# Patient Record
Sex: Male | Born: 1970 | Race: White | Hispanic: No | Marital: Married | Smoking: Current every day smoker
Health system: Southern US, Community
[De-identification: ages and names within clinical notes are randomized; demographics above are authoritative.]

## PROBLEM LIST (undated history)

## (undated) DIAGNOSIS — I10 Essential (primary) hypertension: Secondary | ICD-10-CM

## (undated) HISTORY — PX: FRACTURE SURGERY: SHX138

---

## 2008-11-03 ENCOUNTER — Emergency Department: Payer: Self-pay | Admitting: Emergency Medicine

## 2011-04-20 ENCOUNTER — Emergency Department: Payer: Self-pay | Admitting: *Deleted

## 2012-09-19 IMAGING — CT CT HEAD WITHOUT CONTRAST
2 series · 15 of 30 positions shown, 19 images · non-contrast
Comparison: none

REASON FOR EXAM: FALL  HIT HEAD  POS LOC
COMMENTS:   May transport without cardiac monitor

[Series 2: without · axial · non-contrast · 0.48mm/px · z∈[-194,-59]mm · 13 of 33 slices shown, 17 images]
[im 3/33  brain]
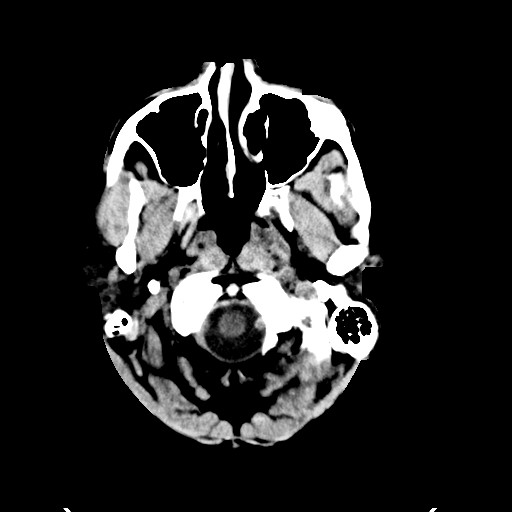
[im 3/33  bone]
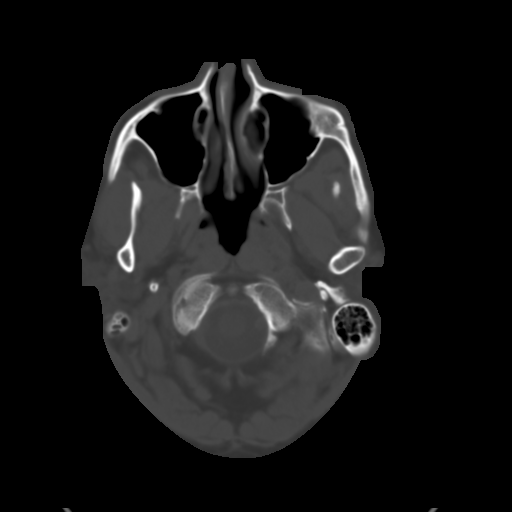
[im 5/33  brain]
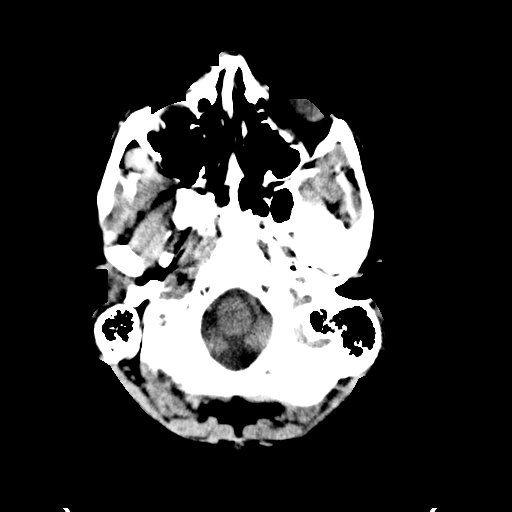
[im 7/33  brain]
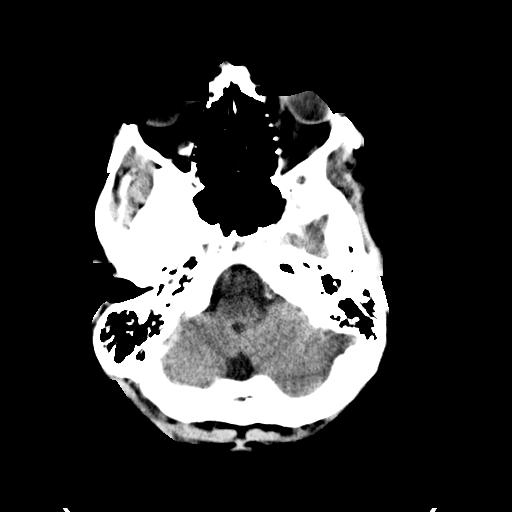
[im 10/33  brain]
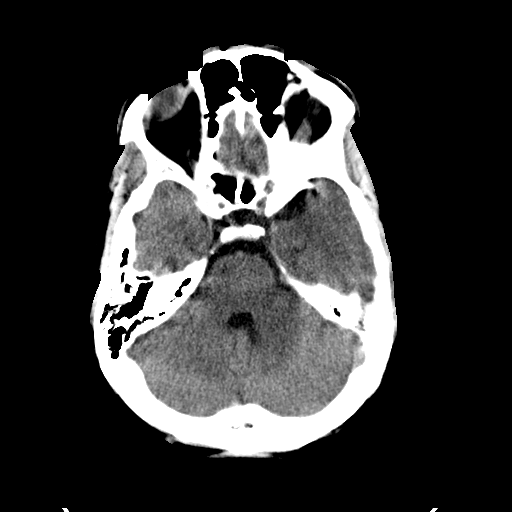
[im 12/33  brain]
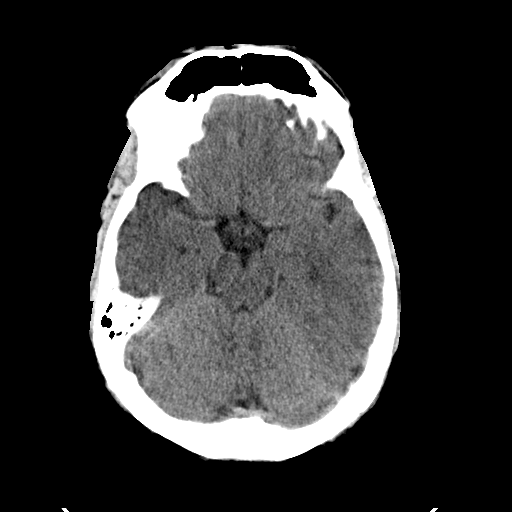
[im 12/33  bone]
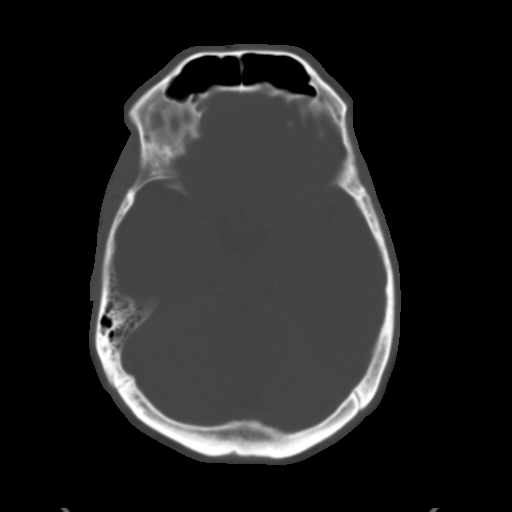
[im 14/33  brain]
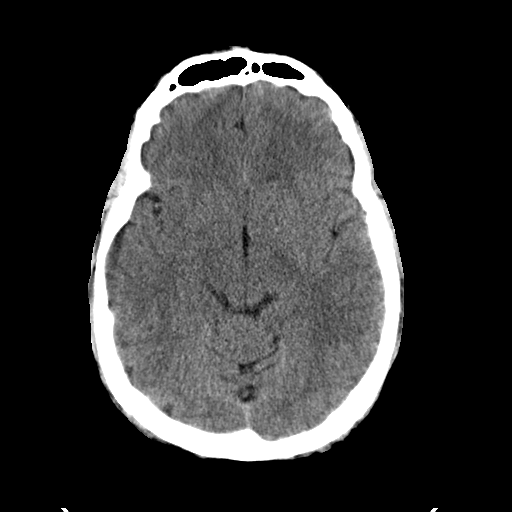
[im 17/33  brain]
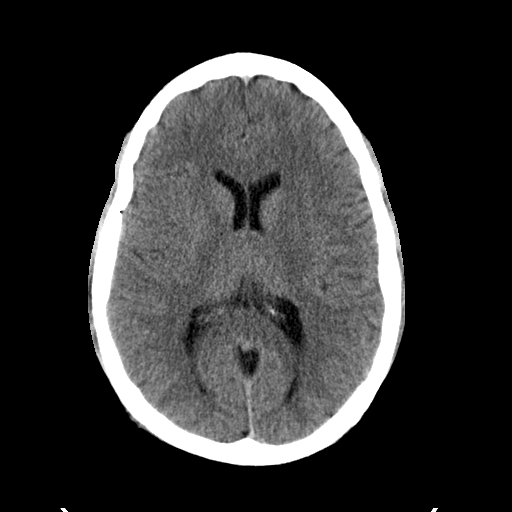
[im 19/33  brain]
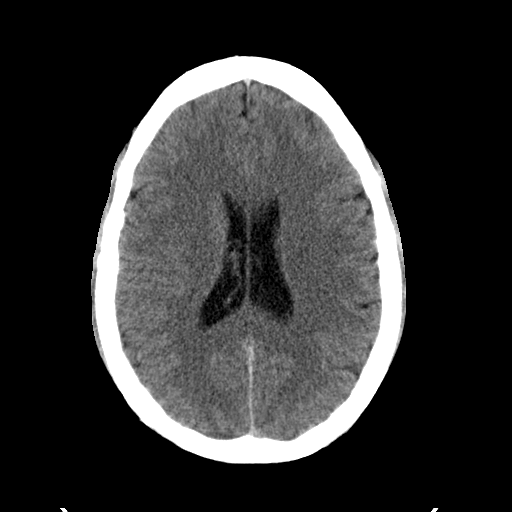
[im 21/33  brain]
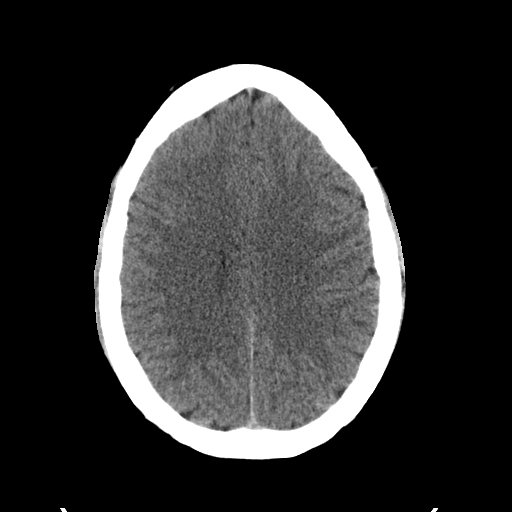
[im 21/33  bone]
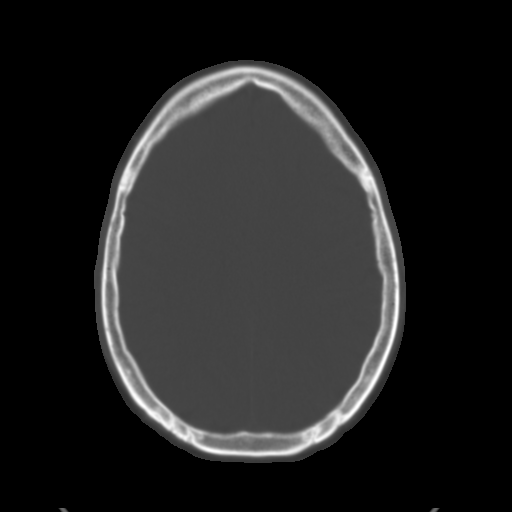
[im 23/33  brain]
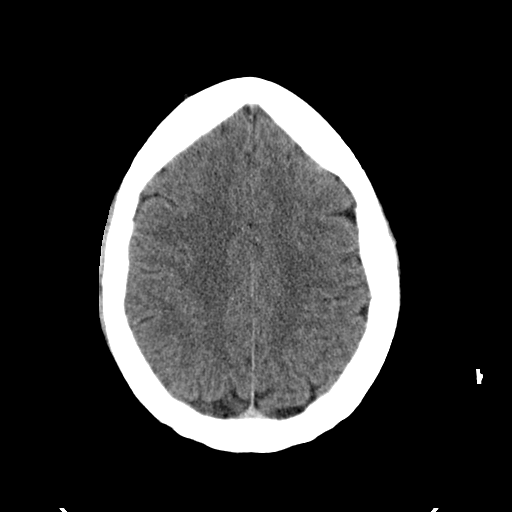
[im 26/33  brain]
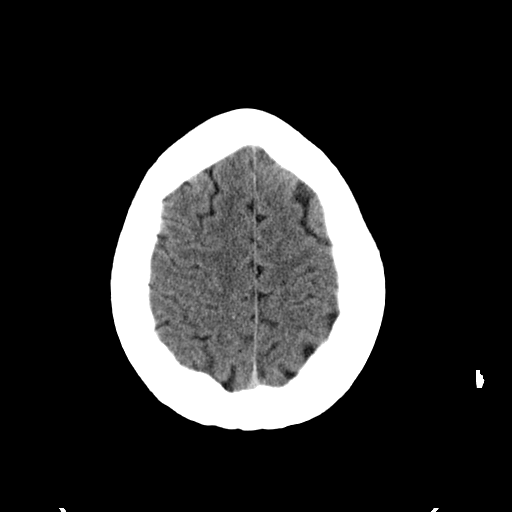
[im 28/33  brain]
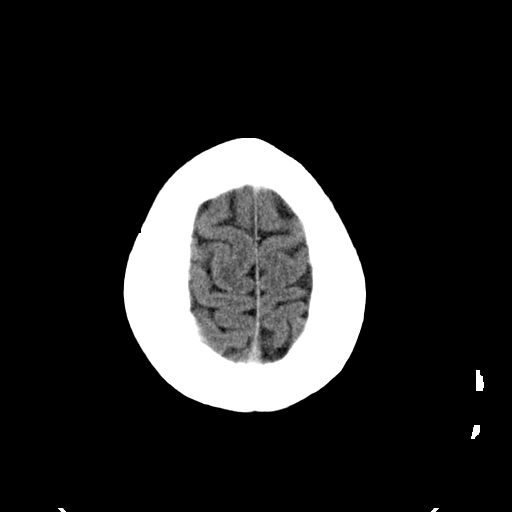
[im 30/33  brain]
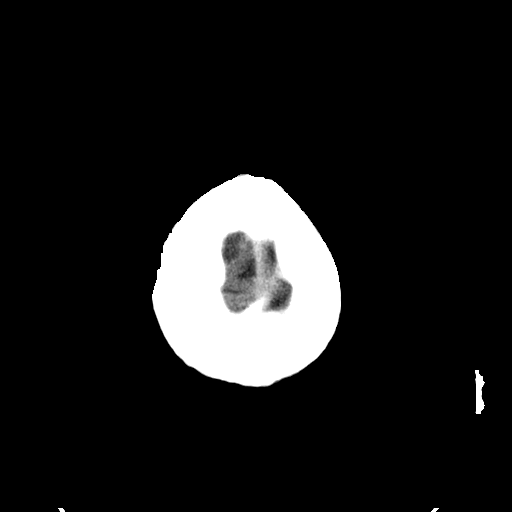
[im 30/33  bone]
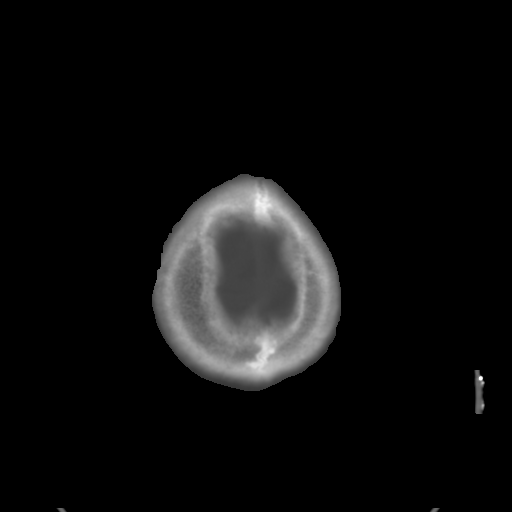

[Series 3: bone · axial · 0.48mm/px · z∈[-194,-174]mm · 2 of 33 slices shown]
[im 3/33  bone]
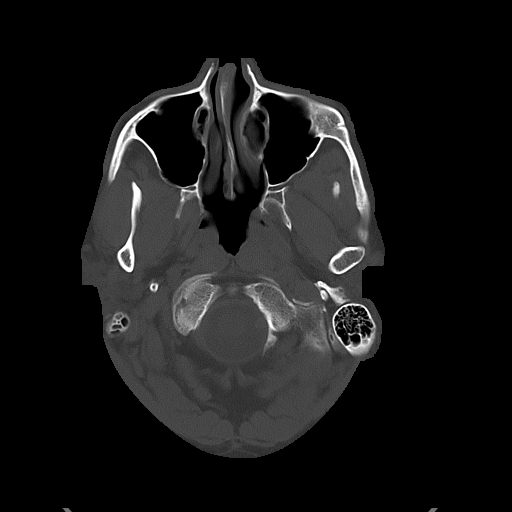
[im 7/33  bone]
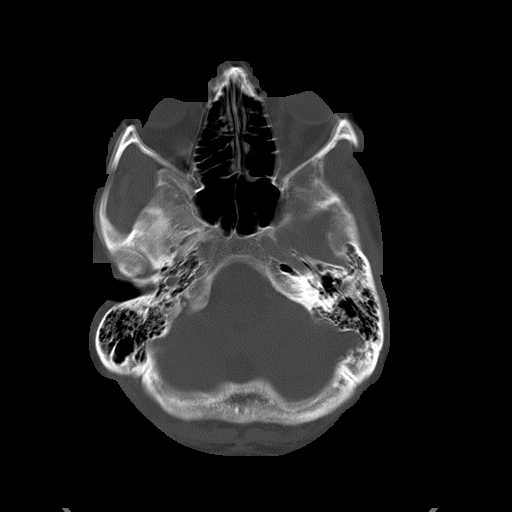

[15 of 30 positions shown; findings below may reference images not displayed]

PROCEDURE:     CT  - CT HEAD WITHOUT CONTRAST  - April 20, 2011  [DATE]

RESULT:     Axial noncontrast CT scanning was performed through the brain at
5 mm intervals and slice thicknesses.

The ventricles are normal in size and position. There is no intracranial
hemorrhage nor intracranial mass effect. The cerebellum and brainstem are
normal in density. There is no evidence of an evolving ischemic event. At
bone window settings the observed portions of the paranasal sinuses and
mastoid air cells are clear. There is soft tissue swelling on and a
laceration of the forehead. I do not see evidence of an acute skull
fracture. There is a depressed fracture of the nasal bone which may be acute
or old. There is overlying soft tissue swelling however.
IMPRESSION: 1. I see no acute intracranial hemorrhage nor other acute abnormality of the
brain.
2. There is no evidence of an acute skull fracture. There is a forehead
laceration and there is a depressed nasal bone fracture.

## 2018-06-07 ENCOUNTER — Ambulatory Visit
Admission: EM | Admit: 2018-06-07 | Discharge: 2018-06-07 | Disposition: A | Discharge status: Home / Self Care | Payer: BC Managed Care – PPO | Attending: Emergency Medicine | Admitting: Emergency Medicine

## 2018-06-07 ENCOUNTER — Encounter: Payer: Self-pay | Admitting: Emergency Medicine

## 2018-06-07 ENCOUNTER — Other Ambulatory Visit: Payer: Self-pay

## 2018-06-07 DIAGNOSIS — J0140 Acute pansinusitis, unspecified: Secondary | ICD-10-CM | POA: Diagnosis not present

## 2018-06-07 HISTORY — DX: Essential (primary) hypertension: I10

## 2018-06-07 MED ORDER — DOXYCYCLINE HYCLATE 100 MG PO CAPS: 100.0000 mg | ORAL_CAPSULE | Freq: Two times a day (BID) | ORAL | 0 refills | Status: AC

## 2018-06-07 NOTE — Discharge Instructions (Addendum)
try an antihistamine/decongestant combination such as Claritin D, Allegra-D, or Zyrtec-D, but if this elevates your blood pressure you should switch to regular Claritin, Allegra or Zyrtec.  saline nasal irrigation with a Lloyd HugerNeil med rinse and distilled water, start Flonase.  doxycycline for the sinusitis.

## 2018-06-07 NOTE — ED Provider Notes (Signed)
HPI  SUBJECTIVE:  Vincent Russo is a 47 y.o. male who presents with clear nasal congestion, rhinorrhea, postnasal drip, sinus pain and pressure for the past 2 to 3 weeks.  Reports chest congestion.  States that he felt feverish, but has no documented fevers.  No upper dental pain.  States his face feels swollen underneath his eyes.  Reports scratchy throat, wheezing in the morning that clears with coughing.  He denies chest pain, shortness of breath, dyspnea on exertion.  He reports allergy symptoms of sneezing, itchy, watery eyes.  He tried Aleve yesterday once without improvement in his symptoms.  No other aggravating or alleviating factors.  He has a past medical history of allergies.  He is a smoker and occasionally vapes.  No history of asthma, emphysema, COPD, diabetes.  He has hypertension for which he takes losartan.  No history of frequent sinusitis.  PMD: Dr. Dereck Leephomas Warcup    Past Medical History:  Diagnosis Date  . Hypertension     Past Surgical History:  Procedure Laterality Date  . FRACTURE SURGERY Right    scapula    Family History  Problem Relation Age of Onset  . CAD Mother   . Cancer Mother   . Hypertension Mother   . Hyperlipidemia Mother   . Diabetes Mother   . Hypertension Father   . Prostate cancer Father     Social History   Tobacco Use  . Smoking status: Current Every Day Smoker    Packs/day: 0.50    Types: Cigarettes  . Smokeless tobacco: Former NeurosurgeonUser    Quit date: 2016  Substance Use Topics  . Alcohol use: Yes    Alcohol/week: 12.0 standard drinks    Types: 12 Cans of beer per week  . Drug use: Never    No current facility-administered medications for this encounter.   Current Outpatient Medications:  .  losartan (COZAAR) 100 MG tablet, Take 1 tablet by mouth daily., Disp: , Rfl:  .  valACYclovir (VALTREX) 500 MG tablet, Take 1 tablet by mouth daily., Disp: , Rfl: 3 .  doxycycline (VIBRAMYCIN) 100 MG capsule, Take 1 capsule (100 mg total) by  mouth 2 (two) times daily for 7 days., Disp: 14 capsule, Rfl: 0  No Known Allergies   ROS  As noted in HPI.   Physical Exam  BP (!) 165/91 (BP Location: Left Arm)   Pulse 79   Temp 98.7 F (37.1 C) (Oral)   Resp 16   Ht 6\' 1"  (1.854 m)   Wt 95.3 kg   SpO2 98%   BMI 27.71 kg/m   Constitutional: Well developed, well nourished, no acute distress Eyes:  EOMI, conjunctiva normal bilaterally HENT: Normocephalic, atraumatic,mucus membranes moist.  Positive mucoid nasal congestion with erythematous, swollen turbinates.  Positive maxillary and frontal sinus tenderness.  Normal oropharynx.  Positive postnasal drip. Respiratory: Normal inspiratory effort lungs clear bilaterally, good air movement. Cardiovascular: Normal rate regular rhythm, no murmurs, rubs, gallops GI: nondistended skin: No rash, skin intact Musculoskeletal: no deformities Neurologic: Alert & oriented x 3, no focal neuro deficits Psychiatric: Speech and behavior appropriate   ED Course   Medications - No data to display  No orders of the defined types were placed in this encounter.   No results found for this or any previous visit (from the past 24 hour(s)). No results found.  ED Clinical Impression  Acute non-recurrent pansinusitis   ED Assessment/Plan  Patient with a sinusitis.  Symptoms appear  to be allergy  driven.  Will have him try an antihistamine/decongestant combination such as Claritin D, Allegra-D, or Zyrtec-D, but if this elevates his blood pressure he is to switch to regular Claritin, Allegra or Zyrtec.  Advised saline nasal irrigation with a Lloyd Huger med rinse and distilled water, he is to start Flonase.  Due to the duration of symptoms, will also send home with doxycycline for the sinusitis.  No evidence of pneumonia.  Follow-up with PMD as needed.  Discussed l MDM, treatment plan, and plan for follow-up with patient.patient agrees with plan.   Meds ordered this encounter  Medications  .  doxycycline (VIBRAMYCIN) 100 MG capsule    Sig: Take 1 capsule (100 mg total) by mouth 2 (two) times daily for 7 days.    Dispense:  14 capsule    Refill:  0    *This clinic note was created using Scientist, clinical (histocompatibility and immunogenetics). Therefore, there may be occasional mistakes despite careful proofreading.   ?    Domenick Gong, MD 06/07/18 2121

## 2018-06-07 NOTE — ED Triage Notes (Signed)
Patient in today c/o cough, sinus pressure x 3 weeks. Patient woke up in sweats this morning, no other fever. Patient has not tried any OTC medications.

## 2021-05-04 ENCOUNTER — Ambulatory Visit
Admission: RE | Admit: 2021-05-04 | Discharge: 2021-05-04 | Disposition: A | Discharge status: Home / Self Care | Payer: BC Managed Care – PPO | Source: Ambulatory Visit | Attending: Internal Medicine | Admitting: Internal Medicine

## 2021-05-04 ENCOUNTER — Other Ambulatory Visit: Payer: Self-pay

## 2021-05-04 VITALS — BP 143/85 | HR 71 | Temp 98.3°F | Resp 14 | Ht 73.0 in | Wt 220.0 lb

## 2021-05-04 DIAGNOSIS — L0231 Cutaneous abscess of buttock: Secondary | ICD-10-CM | POA: Diagnosis not present

## 2021-05-04 DIAGNOSIS — L03317 Cellulitis of buttock: Secondary | ICD-10-CM

## 2021-05-04 MED ORDER — DOXYCYCLINE HYCLATE 100 MG PO CAPS: 100.0000 mg | ORAL_CAPSULE | Freq: Two times a day (BID) | ORAL | 0 refills | Status: AC

## 2021-05-04 NOTE — ED Triage Notes (Signed)
Patient states that he noticed a tender bump on his left buttock since Monday.  Patient denies any drainage.  Patient denies any fever or chills.

## 2021-05-04 NOTE — Discharge Instructions (Addendum)
Apply heat to the area for 20 minutes 3-4 times a day

## 2021-05-04 NOTE — ED Provider Notes (Signed)
[facility]    CSN: [id number] Arrival date & time: 05/04/21  1135      History   Chief Complaint Chief Complaint  Patient presents with   Abscess    Left buttock    HPI Vincent Russo is a 50 y.o. male who presents with tender lump on his L buttocks x 6 days. Denies fever, chills or area draining. He had this a few weeks ago and resolved on its own. Has not had this before. The pain is describes a little achy. Denies sensation of burning or pain radiating anywhere.     Past Medical History:  Diagnosis Date   Hypertension     There are no problems to display for this patient.   Past Surgical History:  Procedure Laterality Date   FRACTURE SURGERY Right    scapula     Home Medications    Prior to Admission medications   Medication Sig Start Date End Date Taking? Authorizing Provider  losartan (COZAAR) 100 MG tablet Take 1 tablet by mouth daily. 05/04/18  Yes [provider]  valACYclovir (VALTREX) 500 MG tablet Take 1 tablet by mouth daily. 05/20/18  Yes [provider]    Family History Family History  Problem Relation Age of Onset   CAD Mother    Cancer Mother    Hypertension Mother    Hyperlipidemia Mother    Diabetes Mother    Hypertension Father    Prostate cancer Father     Social History Social History   Tobacco Use   Smoking status: Every Day    Packs/day: 0.50    Types: Cigarettes   Smokeless tobacco: Former    Quit date: 2016  Vaping Use   Vaping Use: Some days   Substances: Nicotine, Flavoring  Substance Use Topics   Alcohol use: Yes    Alcohol/week: 12.0 standard drinks    Types: 12 Cans of beer per week   Drug use: Never     Allergies   Patient has no known allergies.   Review of Systems Review of Systems  Constitutional:  Negative for chills, diaphoresis and fatigue.  Skin:  Negative for rash and wound.       Redness and lump  Hematological:  Negative for adenopathy.    Physical  Exam Triage Vital Signs ED Triage Vitals  Enc Vitals Group     BP 05/04/21 1202 (!) 143/85     Pulse Rate 05/04/21 1202 71     Resp 05/04/21 1202 14     Temp 05/04/21 1202 98.3 F (36.8 C)     Temp Source 05/04/21 1202 Oral     SpO2 05/04/21 1202 100 %     Weight 05/04/21 1201 220 lb (99.8 kg)     Height 05/04/21 1201 6\' 1"  (1.854 m)     Head Circumference --      Peak Flow --      Pain Score 05/04/21 1200 4     Pain Loc --      Pain Edu? --      Excl. in GC? --    No data found.  Updated Vital Signs BP (!) 143/85 (BP Location: Left Arm)   Pulse 71   Temp 98.3 F (36.8 C) (Oral)   Resp 14   Ht 6\' 1"  (1.854 m)   Wt 220 lb (99.8 kg)   SpO2 100%   BMI 29.03 kg/m   Visual Acuity Right Eye Distance:   Left Eye Distance:  Bilateral Distance:    Right Eye Near:   Left Eye Near:    Bilateral Near:     Physical Exam Vitals and nursing note reviewed.  Constitutional:      General: He is not in acute distress.    Appearance: He is not toxic-appearing.  HENT:     Head: Normocephalic.     Right Ear: External ear normal.     Left Ear: External ear normal.  Eyes:     General: No scleral icterus.    Conjunctiva/sclera: Conjunctivae normal.  Pulmonary:     Effort: Pulmonary effort is normal.  Musculoskeletal:        General: Normal range of motion.     Cervical back: Neck supple.  Skin:    General: Skin is warm and dry.     Comments: L Buttocks- with 2 circular red and mildly indurated areas with cluster of pustules on the surface. Area is warm and mildly tender.   Neurological:     Mental Status: He is alert and oriented to person, place, and time.     Gait: Gait normal.  Psychiatric:        Mood and Affect: Mood normal.        Behavior: Behavior normal.        Thought Content: Thought content normal.        Judgment: Judgment normal.     UC Treatments / Results  Labs (all labs ordered are listed, but only abnormal results are displayed) Labs Reviewed -  No data to display  EKG   Radiology No results found.  Procedures Procedures (including critical care time)  Medications Ordered in UC Medications - No data to display  Initial Impression / Assessment and Plan / UC Course  I have reviewed the triage vital signs and the nursing notes. Abscess of L buttocks. I placed him on Doxy as noted. See instructions.  Final Clinical Impressions(s) / UC Diagnoses   Final diagnoses:  None   Discharge Instructions   None    ED Prescriptions   None    PDMP not reviewed this encounter.   Garey Ham, PA-C 05/04/21 1226
